# Patient Record
Sex: Female | Born: 1935 | Race: White | Hispanic: No | Marital: Single
Health system: Southern US, Community
[De-identification: ages and names within clinical notes are randomized; demographics above are authoritative.]

---

## 2008-07-27 ENCOUNTER — Emergency Department: Payer: Self-pay | Admitting: Emergency Medicine

## 2008-08-08 ENCOUNTER — Ambulatory Visit: Payer: Self-pay | Admitting: Family Medicine

## 2008-08-09 ENCOUNTER — Ambulatory Visit: Payer: Self-pay | Admitting: Family Medicine

## 2013-01-01 ENCOUNTER — Emergency Department: Payer: Self-pay | Admitting: Emergency Medicine

## 2013-01-01 LAB — URINALYSIS, COMPLETE
Bacteria: NONE SEEN
Bilirubin,UR: NEGATIVE
Glucose,UR: NEGATIVE mg/dL (ref 0–75)
Ketone: NEGATIVE
Nitrite: NEGATIVE
Ph: 6 (ref 4.5–8.0)
RBC,UR: 59 /HPF (ref 0–5)
Specific Gravity: 1.019 (ref 1.003–1.030)

## 2013-01-03 LAB — URINE CULTURE

## 2013-01-11 LAB — CBC
HCT: 34.9 % — ABNORMAL LOW (ref 35.0–47.0)
HGB: 11.8 g/dL — ABNORMAL LOW (ref 12.0–16.0)
MCHC: 33.9 g/dL (ref 32.0–36.0)
Platelet: 212 10*3/uL (ref 150–440)
RBC: 3.91 10*6/uL (ref 3.80–5.20)

## 2013-01-12 ENCOUNTER — Inpatient Hospital Stay: Payer: Self-pay | Admitting: Internal Medicine

## 2013-01-12 LAB — BASIC METABOLIC PANEL
Anion Gap: 11 (ref 7–16)
Anion Gap: 12 (ref 7–16)
Calcium, Total: 8.7 mg/dL (ref 8.5–10.1)
Chloride: 107 mmol/L (ref 98–107)
Chloride: 112 mmol/L — ABNORMAL HIGH (ref 98–107)
Co2: 17 mmol/L — ABNORMAL LOW (ref 21–32)
Creatinine: 10.17 mg/dL — ABNORMAL HIGH (ref 0.60–1.30)
Creatinine: 11.04 mg/dL — ABNORMAL HIGH (ref 0.60–1.30)
Creatinine: 11.77 mg/dL — ABNORMAL HIGH (ref 0.60–1.30)
EGFR (African American): 3 — ABNORMAL LOW
EGFR (African American): 3 — ABNORMAL LOW
EGFR (Non-African Amer.): 2 — ABNORMAL LOW
EGFR (Non-African Amer.): 3 — ABNORMAL LOW
EGFR (Non-African Amer.): 3 — ABNORMAL LOW
Glucose: 87 mg/dL (ref 65–99)
Osmolality: 319 (ref 275–301)
Osmolality: 322 (ref 275–301)
Osmolality: 323 (ref 275–301)
Potassium: 3.9 mmol/L (ref 3.5–5.1)
Sodium: 143 mmol/L (ref 136–145)
Sodium: 141 mmol/L (ref 136–145)

## 2013-01-12 LAB — CK: CK, Total: 35 U/L (ref 21–215)

## 2013-01-12 LAB — URINALYSIS, COMPLETE
Bacteria: NONE SEEN
Glucose,UR: NEGATIVE mg/dL (ref 0–75)
Leukocyte Esterase: NEGATIVE
Nitrite: NEGATIVE
Ph: 6 (ref 4.5–8.0)
Protein: NEGATIVE
RBC,UR: 13 /HPF (ref 0–5)
Squamous Epithelial: 2
WBC UR: 4 /HPF (ref 0–5)

## 2013-01-12 LAB — HEPATIC FUNCTION PANEL A (ARMC)
Albumin: 2.7 g/dL — ABNORMAL LOW (ref 3.4–5.0)
Alkaline Phosphatase: 50 U/L
Bilirubin, Direct: 0.2 mg/dL (ref 0.00–0.20)
Bilirubin,Total: 0.4 mg/dL (ref 0.2–1.0)
Total Protein: 5.6 g/dL — ABNORMAL LOW (ref 6.4–8.2)

## 2013-01-12 LAB — TSH: Thyroid Stimulating Horm: 3.53 u[IU]/mL

## 2013-01-12 LAB — HEMOGLOBIN: HGB: 10.9 g/dL — ABNORMAL LOW (ref 12.0–16.0)

## 2013-01-12 LAB — PHOSPHORUS: Phosphorus: 5.7 mg/dL — ABNORMAL HIGH (ref 2.5–4.9)

## 2013-01-13 LAB — BASIC METABOLIC PANEL
Calcium, Total: 8 mg/dL — ABNORMAL LOW (ref 8.5–10.1)
Chloride: 107 mmol/L (ref 98–107)
Co2: 23 mmol/L (ref 21–32)
EGFR (African American): 5 — ABNORMAL LOW
EGFR (Non-African Amer.): 4 — ABNORMAL LOW
Glucose: 90 mg/dL (ref 65–99)
Osmolality: 300 (ref 275–301)
Potassium: 3.8 mmol/L (ref 3.5–5.1)
Sodium: 140 mmol/L (ref 136–145)

## 2013-01-13 LAB — CBC WITH DIFFERENTIAL/PLATELET
Basophil #: 0 10*3/uL (ref 0.0–0.1)
Basophil %: 0.5 %
Eosinophil #: 0.1 10*3/uL (ref 0.0–0.7)
HCT: 28.3 % — ABNORMAL LOW (ref 35.0–47.0)
HGB: 9.8 g/dL — ABNORMAL LOW (ref 12.0–16.0)
MCHC: 34.5 g/dL (ref 32.0–36.0)
MCV: 88 fL (ref 80–100)
Monocyte #: 1.2 x10 3/mm — ABNORMAL HIGH (ref 0.2–0.9)
Neutrophil %: 75.5 %
Platelet: 159 10*3/uL (ref 150–440)
RBC: 3.2 10*6/uL — ABNORMAL LOW (ref 3.80–5.20)
RDW: 13.4 % (ref 11.5–14.5)
WBC: 9 10*3/uL (ref 3.6–11.0)

## 2013-01-14 LAB — URINE CULTURE

## 2013-01-14 LAB — RENAL FUNCTION PANEL
Albumin: 2.5 g/dL — ABNORMAL LOW (ref 3.4–5.0)
Anion Gap: 7 (ref 7–16)
BUN: 33 mg/dL — ABNORMAL HIGH (ref 7–18)
Calcium, Total: 8.3 mg/dL — ABNORMAL LOW (ref 8.5–10.1)
Chloride: 105 mmol/L (ref 98–107)
Creatinine: 4.67 mg/dL — ABNORMAL HIGH (ref 0.60–1.30)
EGFR (African American): 8 — ABNORMAL LOW
EGFR (Non-African Amer.): 7 — ABNORMAL LOW
Sodium: 140 mmol/L (ref 136–145)

## 2013-01-14 LAB — HEMOGLOBIN: HGB: 10.3 g/dL — ABNORMAL LOW (ref 12.0–16.0)

## 2013-01-15 LAB — BASIC METABOLIC PANEL
Calcium, Total: 8.1 mg/dL — ABNORMAL LOW (ref 8.5–10.1)
Co2: 29 mmol/L (ref 21–32)
EGFR (African American): 14 — ABNORMAL LOW
EGFR (Non-African Amer.): 12 — ABNORMAL LOW
Glucose: 97 mg/dL (ref 65–99)
Osmolality: 274 (ref 275–301)
Potassium: 3.2 mmol/L — ABNORMAL LOW (ref 3.5–5.1)

## 2013-01-15 LAB — PHOSPHORUS: Phosphorus: 3.2 mg/dL (ref 2.5–4.9)

## 2013-01-17 ENCOUNTER — Ambulatory Visit: Payer: Self-pay | Admitting: Internal Medicine

## 2013-01-17 LAB — BASIC METABOLIC PANEL
BUN: 16 mg/dL (ref 7–18)
Calcium, Total: 7.8 mg/dL — ABNORMAL LOW (ref 8.5–10.1)
Co2: 26 mmol/L (ref 21–32)
Creatinine: 3.14 mg/dL — ABNORMAL HIGH (ref 0.60–1.30)
Glucose: 82 mg/dL (ref 65–99)
Sodium: 142 mmol/L (ref 136–145)

## 2013-01-18 LAB — MAGNESIUM: Magnesium: 1.5 mg/dL — ABNORMAL LOW

## 2013-01-18 LAB — BASIC METABOLIC PANEL
BUN: 13 mg/dL (ref 7–18)
EGFR (Non-African Amer.): 12 — ABNORMAL LOW
Glucose: 91 mg/dL (ref 65–99)
Potassium: 4.2 mmol/L (ref 3.5–5.1)
Sodium: 141 mmol/L (ref 136–145)

## 2013-01-19 LAB — BASIC METABOLIC PANEL
Anion Gap: 12 (ref 7–16)
BUN: 14 mg/dL (ref 7–18)
Co2: 16 mmol/L — ABNORMAL LOW (ref 21–32)
EGFR (African American): 15 — ABNORMAL LOW
Osmolality: 287 (ref 275–301)
Potassium: 3.6 mmol/L (ref 3.5–5.1)

## 2013-01-19 LAB — CBC WITH DIFFERENTIAL/PLATELET
Basophil #: 0.1 10*3/uL (ref 0.0–0.1)
Basophil %: 0.9 %
Eosinophil #: 0.2 10*3/uL (ref 0.0–0.7)
Lymphocyte #: 0.8 10*3/uL — ABNORMAL LOW (ref 1.0–3.6)
MCH: 30.6 pg (ref 26.0–34.0)
MCHC: 33.5 g/dL (ref 32.0–36.0)
Monocyte #: 0.7 x10 3/mm (ref 0.2–0.9)
Neutrophil %: 78.3 %
RDW: 13.9 % (ref 11.5–14.5)

## 2013-02-17 ENCOUNTER — Ambulatory Visit: Payer: Self-pay | Admitting: Internal Medicine

## 2013-05-18 DEATH — deceased

## 2014-06-09 NOTE — Consult Note (Signed)
CHIEF COMPLAINT and HISTORY:  Subjective/Chief Complaint renal failure, poor appetite   History of Present Illness Patient is a 81 yo WF with one week of lethargy and poor appetite.  Found to be in renal failure with BUN >100 and Cr over 11.  She is a poor history.   We are asked to place HD catheter   PAST MEDICAL/SURGICAL HISTORY:  Past Medical History:   stroke:    Hypothyroidism:    diabetes:    hypertension:    pacemaker:   ALLERGIES:  Allergies:  No Known Allergies:   HOME MEDICATIONS:  Home Medications: Medication Instructions Status  atorvastatin 10 mg oral tablet 1 tab(s) orally once a day (at bedtime) Active  atenolol 25 mg oral tablet 1 tab(s) orally once a day Active  Aspirin Enteric Coated 81 mg oral delayed release tablet 1 tab(s) orally once a day Active  levothyroxine 50 mcg (0.05 mg) oral tablet 1 tab(s) orally once a day Active   Family and Social History:  Family History Non-Contributory   Social History negative tobacco, negative ETOH   Review of Systems:  ROS Pt not able to provide ROS   Physical Exam:  GEN thin, elderly WF in NAD   HEENT pink conjunctivae, very hard of hearing   NECK No masses  trachea midline   RESP normal resp effort  no use of accessory muscles   CARD regular rate  no JVD   VASCULAR ACCESS none   ABD denies tenderness  normal BS   GU foley catheter in place  clear yellow urine draining  no superpubic tenderness   LYMPH negative neck, negative axillae   EXTR negative cyanosis/clubbing, negative edema   SKIN normal to palpation, skin turgor good   NEURO cranial nerves intact, motor/sensory function intact   PSYCH alert, A+O to time, place, person   LABS:  Laboratory Results: Thyroid:    26-Nov-14 08:17, Thyroid Stimulating Hormone  Thyroid Stimulating Hormone 3.53  0.45-4.50  (International Unit)   -----------------------  Pregnant patients have   different reference   ranges for TSH:   - - - - -  - - - - -   Pregnant, first trimetser:   0.36 - 2.50 uIU/mL  Hepatic:    26-Nov-14 08:17, Hepatic Function Panel A  Bilirubin, Total 0.4  Bilirubin, Direct 0.2  Result(s) reported on 12 Jan 2013 at 08:54AM.  Alkaline Phosphatase 50  45-117  NOTE: New Reference Range  01/07/13  SGPT (ALT) 9  SGOT (AST) 10  Total Protein, Serum 5.6  Albumin, Serum 2.7  Routine Chem:    25-Nov-14 76:81, Basic Metabolic Panel (w/Total Calcium)  Glucose, Serum 122  BUN 130  Creatinine (comp) 11.77  Sodium, Serum 140  Potassium, Serum 4.2  Chloride, Serum 107  CO2, Serum 22  Calcium (Total), Serum 8.7  Anion Gap 11  Osmolality (calc) 323  eGFR (African American) 3  eGFR (Non-African American) 2  eGFR values <25m/min/1.73 m2 may be an indication of chronic  kidney disease (CKD).  Calculated eGFR is useful in patients with stable renal function.  The eGFR calculation will not be reliable in acutely ill patients  when serum creatinine is changing rapidly. It is not useful in   patients on dialysis. The eGFR calculation may not be applicable  to patients at the low and high extremes of body sizes, pregnant  women, and vegetarians.    26-Nov-14 015:72 Basic Metabolic Panel (w/Total Calcium)  Glucose, Serum 87  BUN 118  Creatinine (  comp) 11.04  Sodium, Serum 143  Potassium, Serum 4.4  Chloride, Serum 113  CO2, Serum 16  Calcium (Total), Serum 8.0  Anion Gap 14  Osmolality (calc) 322  eGFR (African American) 3  eGFR (Non-African American) 3  eGFR values <55m/min/1.73 m2 may be an indication of chronic  kidney disease (CKD).  Calculated eGFR is useful in patients with stable renal function.  The eGFR calculation will not be reliable in acutely ill patients  when serum creatinine is changing rapidly. It is not useful in   patients on dialysis. The eGFR calculation may not be applicable  to patients at the low and high extremes of body sizes, pregnant  women, and vegetarians.  Misc Urine  Chem:    26-Nov-14 23:37, Sodium, Urine Random  Sodium, Urine Random 46  Result(s) reported on 12 Jan 2013 at 09:25AM.  Cardiac:    26-Nov-14 08:17, CK, Total  CK, Total 35  Result(s) reported on 12 Jan 2013 at 09:48AM.  Routine UA:    25-Nov-14 23:37, Urinalysis  Color (UA) Yellow  Clarity (UA) Clear  Glucose (UA) Negative  Bilirubin (UA) Negative  Ketones (UA) Negative  Specific Gravity (UA) 1.011  Blood (UA) 2+  pH (UA) 6.0  Protein (UA) Negative  Nitrite (UA) Negative  Leukocyte Esterase (UA) Negative  Result(s) reported on 12 Jan 2013 at 12:05AM.  RBC (UA) 13 /HPF  WBC (UA) 4 /HPF  Bacteria (UA)   NONE SEEN  Epithelial Cells (UA) 2 /HPF  Result(s) reported on 12 Jan 2013 at 12:05AM.  Routine Hem:    25-Nov-14 23:32, Hemogram, Platelet Count  WBC (CBC) 10.2  RBC (CBC) 3.91  Hemoglobin (CBC) 11.8  Hematocrit (CBC) 34.9  Platelet Count (CBC) 212  Result(s) reported on 11 Jan 2013 at 11:56PM.  MCV 89  MCH 30.2  MCHC 33.9  RDW 13.9    26-Nov-14 08:17, Hemoglobin  Hemoglobin (CBC) 10.9  Result(s) reported on 12 Jan 2013 at 08:41AM.   RADIOLOGY:  Radiology Results: XRay:    15-Nov-14 07:49, Chest Portable Single View  Chest Portable Single View  REASON FOR EXAM:    ams  COMMENTS:       PROCEDURE: DXR - DXR PORTABLE CHEST SINGLE VIEW  - Jan 01 2013  7:49AM     CLINICAL DATA:  Shortness of breath    EXAM:  PORTABLE CHEST - 1 VIEW    COMPARISON:  07/27/2008    FINDINGS:  Mild cardiomegaly without edema or definite pneumonia. Pacer device  obscures the left lower lobe. No large effusion or pneumothorax.  Atherosclerosis of the aorta and subclavian vasculature. No new  collapse or consolidation. Overall stable exam.     IMPRESSION:  Stable cardiomegaly without CHF or pneumonia.    Aortic atherosclerosis      Electronically Signed    By: TDaryll BrodM.D.    On: 01/01/2013 07:52         Verified By: MEarl Gala M.D.,  [place]    26-Nov-14  09:35, [place]  [place]  REASON FOR EXAM:    ARF  COMMENTS:       PROCEDURE: [place] - [place]  - Jan 12 2013  9:35AM     CLINICAL DATA:  Acute renal failure    EXAM:  RENAL/URINARY TRACT ULTRASOUND COMPLETE    COMPARISON:  Unenhanced CT abdomen pelvis dated 08/09/2008    FINDINGS:  Right Kidney  Length: 9.2 cm. 9 x 10 x 10 mm  probable lower pole cyst, possibly  complicated by at least one internal septation, poorly evaluated. No  hydronephrosis.    Left Kidney    Length: 10.9 cm. 3.5 x 2.7 x 2.2 cm complex/ irregular lower pole  cyst, likely corresponding to a 2.8 x 2.0 cm suspected hemorrhagic  cyst on prior 2010 CT. 7 x 6 x 9 mm probable upper pole renal cyst.  No hydronephrosis.    Bladder    Not visualized with indwelling Foley catheter.   IMPRESSION:  No hydronephrosis.    3.5 cm complex/irregular left lower pole renal cyst, corresponding  to a suspected 2.8 cm hemorrhagic cyst on prior 2010 CT.    10 mm right lower pole renal cyst, possibly complicated by at least  one internal septation, likely benign.      Electronically Signed    By: Julian Hy M.D.    On: 01/12/2013 10:10       Verified By: Julian Hy, M.D.,  LabUnknown:    15-Nov-14 07:49, Chest Portable Single View  PACS Image    [id number] 09:35, US Kidney Bilateral  PACS Image   ASSESSMENT AND PLAN:  Assessment/Admission Diagnosis renal failure   Plan HD catheter placed at bedside without difficulty   level 3   Electronic Signatures: Algernon Huxley (MD)  (Signed [id number] 16:28)  Authored: Chief Complaint and History, PAST MEDICAL/SURGICAL HISTORY, ALLERGIES, HOME MEDICATIONS, Family and Social History, Review of Systems, Physical Exam, LABS, RADIOLOGY, Assessment and Plan   Last Updated: 26-Nov-14 16:28 by Algernon Huxley (MD)

## 2014-06-09 NOTE — Discharge Summary (Signed)
PATIENT NAME:  Carolyn Flores, Carolyn Flores MR#:  [id number] DATE OF BIRTH:  [birth year]  DATE OF ADMISSION:  01/12/2013 DATE OF DISCHARGE:  01/20/2013  ADMITTING DIAGNOSIS:  Acute renal failure.    DISCHARGE DIAGNOSES: 1.  Acute renal failure status post hemodialysis with catheter placement by Dr. Wyn Quaker and a few hemodialysis sessions. 2.  Altered mental status, likely metabolic encephalopathy. 3.  Suspected dementia.  4.  Hypertension.  5.  Hypothyroidism.  6.  Acidosis due to renal failure.  7.  Hypokalemia.  8.  Hypomagnesemia. 9.  Hypoxia, acute respiratory failure with fluid overload, likely congestive heart failure.  10. Diabetes mellitus.   OPERATIONS AND PROCEDURES: Right thigh non-tunneled hemodialysis catheter placement by Dr. Wyn Quaker on 01/12/2013.   DISCHARGE CONDITION: Poor.   DISCHARGE MEDICATIONS: The patient is to resume atorvastatin 10 mg p.o. at bedtime, levothyroxine 50 mcg p.o. daily, morphine 5 mg every 1 to 2 hours as needed, lorazepam  0.5 mg 3 times daily as needed. The patient was advised not to take any other medications unless recommended by a primary care physician or other medical doctor.   HOME OXYGEN: With 2 liters of oxygen through nasal cannula.   DIET: Two-gram salt, low-fat, low-cholesterol, carbohydrate-controlled diet, mechanical soft.     FOLLOWUP APPOINTMENT: With Dr. Dayna Barker in 2 days after discharge.   The patient is being discharged to home with hospice.   CONSULTANTS: Dr. Thedore Mins, Dr. Harvie Junior, Dr. Mady Haagensen, Dr. Wyn Quaker.  RADIOLOGIC STUDIES:  Chest, portable single view, 01/16/2013, showed COPD and probable CHF.   The patient is a 81 year old Caucasian female with past medical history significant for history of hypothyroidism, hypertension, as well as diabetes mellitus, history of stroke, who presented to the hospital with altered mental status, as well as abnormal labs. Please refer to Dr. Clarita Leber  admission note on 01/12/2013. Apparently, the patient  was seen by her primary care physician. Her labs were checked, and her labs were markedly abnormal. She was sent to the Emergency Room where her labs were checked and BUN was found to be 130 and creatinine was 11.7. Urine was unremarkable. The patient's physical exam was unremarkable. EKG was not done. Otherwise, lab studies revealed a glucose of 122. Liver enzymes were remarkable for albumin level of 2.7. CK total was 35. Thyroid stimulating hormone was 3.53. White blood cell count was normal at 10.2. Hemoglobin was 11.8 and platelet count was 212. Urine sodium in urine random was 46. Urine culture showed no growth. The patient was admitted to the hospital for further evaluation. She was started on IV fluids. Consultation with nephrologist was obtained, as well as vascular surgeon. The vascular surgeon saw the patient in consultation. Discussion was held about possibility about hemodialysis. The patient's family decided that they would like to try at a few hemodialysis sessions, so Dr. Festus Barren placed a temporary non-tunneled  dialysis catheter in the right thigh. Post procedure, the patient was taken to hemodialysis, and she underwent a few hemodialysis sessions.  With hemodialysis, the patient's creatinine improved and urine output somewhat increased. The patient was noted to be somewhat hypotensive post hemodialysis and was initiated on IV fluids. She was continued on IV fluids until she was fluid overloaded, developed shortness of breath and her chest x-ray revealed congestive heart failure. At that point, she was given Lasix and no more IV fluids. The patient continues to make urine and makes approximately 325 to 450 a shift in 8 hours. The patient's oral intake, however, is very poor.  Palliative care to discuss this with family, and family decided that hospice care would be appropriate at home. The patient is being discharged home with hospice on 01/20/2013. As the patient was somewhat short of breath, she  required some oxygen supplementation; however, with diuresis, the patient's oxygenation has improved. Since the patient is at risk of fluid overload and retention, we are to continue her on oxygen therapy. In regards to diabetes mellitus, as her p.o. intake was unsatisfactory, the patient, at this point, is to continue any diet she wants to. For altered mental status thought to be metabolic encephalopathy and likely related to renal failure. With hemodialysis, as well as improvement of her kidney function, the patient's mental status also improved; however, the patient still remained somewhat withdrawn and would not communicate with physician; however, according to the patient's family, she was communicating with the family quite well. I suspect some dementia. In regards to hypothyroidism, the patient's TSH was normal. The patient's Synthroid was resumed.  The patient was noted to be acidotic initially on arrival to the hospital. Acidosis resolved with hemodialysis. It was felt to be due to acute renal failure. The patient also had multiple metabolic derangements with potassium, as well as magnesium abnormalities, which were supplemented as needed. The patient is being discharged in stable condition with the above-mentioned medications and followup. She is not to use any blood pressure medications in order to keep blood pressure stable.   On the day of discharge, the patient's vital signs: Temperature is 98.2, however, ranging between 96.5 to 99.9. Pulse is ranging from 85 to 111. Respiration rate was 18 to 19. Blood pressure ranging from 99 to 135 systolic and 60s to 90s diastolic. O2 sats were 94% to 95% on  room air at rest.   TIME SPENT: 40 minutes.   ____________________________ Katharina Caperima Xander Jutras, MD rv:dmm D: 01/20/2013 18:45:00 ET T: 01/20/2013 21:49:58 ET JOB#: [id number]  cc: Katina DungBarbara D. Dayna BarkerAldridge, MD Katharina Caperima Anjani Feuerborn, MD, <Dictator> Alabama Doig MD ELECTRONICALLY SIGNED 01/26/2013 15:35

## 2014-06-09 NOTE — Op Note (Signed)
PATIENT NAME:  Carolyn Flores, Carolyn Flores MR#:  [id number] DATE OF BIRTH:  04/06/1913  DATE OF PROCEDURE:  01/12/2013  PREOPERATIVE DIAGNOSIS: Renal failure with uremia.   POSTOPERATIVE DIAGNOSIS: Renal failure with uremia.   PROCEDURES:   1.  Ultrasound guidance for vascular access, right femoral vein.  2.  Placement of right femoral Trialysis-type dialysis catheter, 30 cm in length.   SURGEON: Annice NeedyJason Flores. Dew, MD   ANESTHESIA: Local.   ESTIMATED BLOOD LOSS: Minimal.   INDICATION FOR PROCEDURE: A 81 year old white female who is admitted with renal failure and a BUN of over 100. We were asked to place a dialysis catheter for immediate dialysis access.   DESCRIPTION OF PROCEDURE: The patient was laid flat on her floor bed. Her right groin was sterilely prepped and draped and a sterile surgical field was created. The femoral vein was visualized on ultrasound and accessed under direct ultrasound guidance without difficulty with a Seldinger needle. A J-wire was placed. After skin nick and dilatation, the 30 cm long Trialysis-type dialysis catheter was placed over the wire and the wire was removed. All 3 lumens withdrew dark red nonpulsatile blood and flushed easily with sterile saline. It was secured at the skin at 29 cm with 3 nylon sutures.    ____________________________ Annice NeedyJason Flores. Dew, MD jsd:jcm D: 01/12/2013 16:30:21 ET T: 01/12/2013 19:48:34 ET JOB#: [id number]  cc: Annice NeedyJason Flores. Dew, MD, <Dictator> Annice NeedyJASON Flores DEW MD ELECTRONICALLY SIGNED 01/24/2013 9:24

## 2014-06-09 NOTE — H&P (Signed)
PATIENT NAME:  Carolyn Flores, Carolyn Flores MR#:  [id number] DATE OF BIRTH:  [birth year]  DATE OF ADMISSION:  01/12/2013  PRIMARY CARE PHYSICIAN: Dr. Leim FabryBarbara Flores.   REFERRING PHYSICIAN: Dr. Lucrezia EuropeAllison Flores.    CHIEF COMPLAINT: Altered mental status and abnormal labs.   HISTORY OF PRESENT ILLNESS: Ms. Carolyn Flores is a 81 year old female with history of hypertension, diabetes mellitus not on any medication, previous history of CVA. Has been confused for the last 10 days. Was noted to have decreased p.o. intake. Concerning this, the patient was brought to the Emergency Department and was found to have a urinary tract infection. The patient was discharged home with ciprofloxacin. The patient continued to have worsening of her weakness with decreased p.o. intake including solids and liquids. Family also noted that the patient has decreased urine output. Concerning this, the patient was taken to the primary care physician where on initial labs the patient was found to be in acute renal failure. Concerning this, the patient was sent to the Emergency Department. Workup in the Emergency Department, the patient was noted to have BUN of 130 and creatinine of 11.7. Urine is negative for urinary tract infection. Unable to obtain any history from the patient as the patient is very hard of hearing. The history is mainly obtained from [name] daughter who is [name] medical power of attorney. At baseline, the patient is able to walk with the help of the family. The patient is able to carry on a conversation if she can hear properly.   PAST MEDICAL HISTORY:  1. Hypertension.  2. Hyperlipidemia.  3. Diabetes mellitus, not on any medication.  4. Previous history of stroke.  5. Pacemaker and defibrillator placement.   ALLERGIES: No known drug allergies.   HOME MEDICATIONS: Ciprofloxacin 1 tablet every 12 hours.   SOCIAL HISTORY: No history of smoking, drinking alcohol or using illicit drugs. Married. Lives with her  2 sons. Has good family support.   FAMILY HISTORY: No obvious health problems run in the family. Has longevity in the family.   REVIEW OF SYSTEMS: Could not be obtained from the patient as the patient is confused as well as very hard of hearing.   PHYSICAL EXAMINATION:  GENERAL: This is a thin-built, frail-looking female, appropriate for stated age, lying down in the bed, lethargic.  VITAL SIGNS: Temperature 97.4, pulse 59, blood pressure 99/44, respiratory rate of 16, oxygen saturation is 92% on room air.  HEENT: Head normocephalic, atraumatic. There is no scleral icterus. Conjunctivae normal. Pupils equal and react to light. Mucous membranes dry. No pharyngeal erythema.  NECK: Supple. No lymphadenopathy. No JVD. No carotid bruit. No thyromegaly.  CHEST: Has no focal tenderness.  LUNGS: Bilaterally clear to auscultation.  HEART: S1, S2 regular. No murmurs are heard.  ABDOMEN: Bowel sounds present. Soft, nontender, nondistended. No hepatosplenomegaly.  EXTREMITIES: No pedal edema. Pulses 2+.  NEUROLOGIC: The patient is very hard of hearing. Difficult to know. [name] orientation seems to be confused; however, following simple commands. Could not examine the motor and sensory.  SKIN: No rash or lesions.  MUSCULOSKELETAL: Could not examine as the patient was not cooperative.   LABORATORIES: BMP: BUN 130, creatinine of 11.77, sodium 140. The rest of all of the values are within normal limits.   CBC: WBC of 10.2, hemoglobin 11.8, platelet count of 212. UA negative for nitrites and leukocyte esterase.   ASSESSMENT AND PLAN: Ms. Carolyn Flores is a 81 year old female who is brought to the Emergency Department for acute renal  failure.  1. Acute renal failure: This is a combination of prerenal as well as acute tubular necrosis secondary to hypertension. Continue with intravenous fluids. The patient is currently receiving 1 liter fluid bolus. Admit the patient to a monitored bed. Continue to monitor  strict ins and outs. Place a Foley catheter. Check urine sodium.  2. Diabetes mellitus: Currently not on any medications. Blood sugar are well within normal limits.  3. Hypertension: Not on any medications.  4. Debility: Will involve physical therapy once the patient is better oriented and improvement with the renal function.   Goals of care discussed with [name] family, and [name] family wants her to be DNR/DNI.   TIME SPENT: 50 minutes.   ____________________________ Carolyn Griffins, MD pv:gb D: 01/12/2013 01:21:54 ET T: 01/12/2013 02:28:42 ET JOB#: [id number]  cc: Carolyn Griffins, MD, <Dictator> Katina Dung. Dayna Barker, MD Clerance Lav Clanton Emanuelson MD ELECTRONICALLY SIGNED 01/16/2013 0:45

## 2014-06-10 NOTE — Consult Note (Signed)
PATIENT NAME:  Carolyn MerlesCOOPER, Alyn S MR#:  [id number] DATE OF BIRTH:  [birth year]  DATE OF CONSULTATION:  01/12/2013  REFERRING PHYSICIAN:  Katharina Caperima Vaickute, MD  CONSULTING PHYSICIAN:  Jeret Goyer Lizabeth LeydenN. Souleymane Saiki, MD  REASON FOR CONSULTATION: Severe acute renal failure.   HISTORY OF PRESENT ILLNESS: The patient is a 81 year old Caucasian female with past medical history of hypertension, hyperlipidemia, history of diabetes mellitus, history of CVA, history of pacemaker and defibrillator placement, who [facility] with altered mental status and decreased p.o. intake. The patient is unable to offer any history at this point in time. The patient's daughter is at bedside and offers most of the history.   It appears that the patient has had rather poor p.o. intake for at least the past 7 days according to her daughter. In addition, the patient has had altered mental status and her speech at times has been irregular. She is followed regularly as an outpatient by Dr. Leim FabryBarbara Aldridge. The patient was taken there for evaluation and was told that she had severe acute renal failure.   Upon presentation here, the patient's BUN was 130 with a creatinine of 11.7. Baseline BUN and creatinine are 18 and 1.28, respectively. After a short course of hydration, BUN was slightly down to 118 and creatinine was slightly down to 11.04. However, this may have been likely dilutional. Urine output was only 230 mL over the past 24 hours. We spoke with the patient's daughter and granddaughter in depth. They preferred a short trial of dialysis to see if the patient's condition would improve.   CODE STATUS: HOWEVER, THE PATIENT DOES HAVE DNR STATUS AT THIS TIME.   PAST MEDICAL HISTORY:  1.  Hypertension.  2.  Diabetes mellitus.  3.  Hyperlipidemia.  4.  Prior history of stroke.  5.  History of pacemaker placement.   ALLERGIES: No known drug allergies.   CURRENT INPATIENT MEDICATIONS:  Include:  1.   Normal saline 0.9 at 125 mL/hour.  2.  Sodium bicarbonate 150 mEq at 50 mL/hour. 3.  Heparin 5000 units subcu q.8 hours.  4.  Sliding scale insulin.  5.  Levothyroxine 0.05 mg p.o. q.6:00 a.m.  6.  Zofran 4 mg IV q.4 hours p.r.n.   SOCIAL HISTORY: The patient resides with her sons. Her daughter also lives nearby. No reported tobacco, alcohol, or illicit drug use.   FAMILY HISTORY: The patient's mother died at an old age. No reported medical issues, however.   REVIEW OF SYSTEMS: Unable to obtain from the patient as she has altered mental status at this time.   PHYSICAL EXAMINATION:  VITAL SIGNS: Temperature 97.3, pulse 79, respirations 16, blood pressure 100/55.  GENERAL: A lethargic but arousable female.  HEENT: Normocephalic, atraumatic. This patient did have spontaneous extraocular movements noted. Pupils equal, round, and reactive to light. No scleral icterus. Conjunctivae are pink. No epistaxis noted. Gross hearing was difficult to assess. Oral mucosae are dry.  NECK: Supple without JVD or lymphadenopathy.  LUNGS: Clear to auscultation bilaterally with normal effort.  HEART: S1, S2, regular rate and rhythm. No obvious pericardial friction rub.  ABDOMEN: Soft, nontender, nondistended. Bowel sounds positive. No rebound or guarding. No gross organomegaly appreciated.  EXTREMITIES: No clubbing, cyanosis, or edema.  NEUROLOGIC: Lethargic but is arousable. She will follow some simple commands if persistent.  SKIN: Warm and dry. Decreased skin turgor noted.  GENITOURINARY: No suprapubic tenderness noted at this time.  MUSCULOSKELETAL: No joint redness, swelling, or tenderness appreciated.  PSYCHIATRIC:  Lethargic but arousable. Does not appear to have any insight into her current illness.   LABORATORY, DIAGNOSTIC AND RADIOLOGICAL DATA: Urine sodium is 46.   A renal ultrasound shows no hydronephrosis. There is a 3.5 cm complex irregular left lower pole renal cyst which corresponded to a  suspected 2 cm hemorrhagic cyst on prior 2010 CT. There is also a 10 mm right lower pole renal cyst complicated by at least 1 internal septation.   BMP shows sodium 143, potassium 4.4, chloride 113, CO2 16, BUN 118, creatinine 11, glucose 87, total bilirubin 0.4, direct bilirubin 0.2, alkaline phosphatase 50, ALT 9, AST 10, total protein 5.6, albumin 2.7, hemoglobin 10.9. TSH 3.53, CK 35. Urinalysis shows 13 RBCs per high-power field, 4 WBCs per high-power field, negative for protein.   IMPRESSION: This is a 81 year old Caucasian female with past medical history of hypertension, hyperlipidemia, diabetes mellitus, prior history of cerebrovascular accident, history of pacemaker placement, who presented to [facility] with poor p.o. intake, altered mental status, and severe acute renal failure.   PROBLEM LIST:  1.  Severe acute renal failure with oliguria.  2.  Metabolic acidosis.  3.  Altered mental status.  4.  Complex left renal cyst.   PLAN: The patient presented with altered mental status, poor p.o. intake, and severe acute renal failure. Suspect that the most likely etiology of her underlying acute renal failure is acute tubular necrosis from prolonged dehydration and prerenal state. Apparently, despite her advanced age, the patient was doing relatively well at home prior to the current illness. She was able to ambulate with the assistance of her sons as well as with a walker. Her mentation prior to this current illness was fairly good according to her daughter.   However, the patient's family does realize that she has advanced age and quite severe medical issues at this time. We talked about different treatment options including a short trial of dialysis versus seeking hospice services. The patient's family prefers a short trial of hemodialysis to see if it improves her condition. A time-limited trial of renal placement therapy would certainly be reasonable.   I appreciate  temporary hemodialysis catheter placement by Dr. Wyn Quaker. We will proceed with hemodialysis this evening with the dialysis session lasting 1.5 hours. We will plan for a blood flow rate of 150, dialysis flow rate of 300, and no ultrafiltration. I will discontinue 0.9 normal saline infusion and continue the bicarbonate infusion. The patient could potentially develop volume overload which may cause significant issue; therefore, we will discontinue the normal saline infusion. We will reassess her mental status after starting dialysis. We will likely plan for  a dialysis session tomorrow as well.   I would like to thank Dr. Winona Legato for this kind referral. Further plan as per as the patient progresses.   ____________________________ Lennox Pippins, MD mnl:np D: 01/12/2013 17:21:05 ET T: 01/12/2013 17:43:48 ET JOB#: [id number]  cc: Lennox Pippins, MD, <Dictator> Ria Comment Karena Kinker MD ELECTRONICALLY SIGNED 02/24/2013 11:24

## 2015-05-14 IMAGING — CR DG CHEST 1V PORT
1 series · 1 of 1 positions shown · non-contrast
Comparison: 07/27/2008

CLINICAL DATA: Shortness of breath

EXAM:
PORTABLE CHEST - 1 VIEW

[ap]
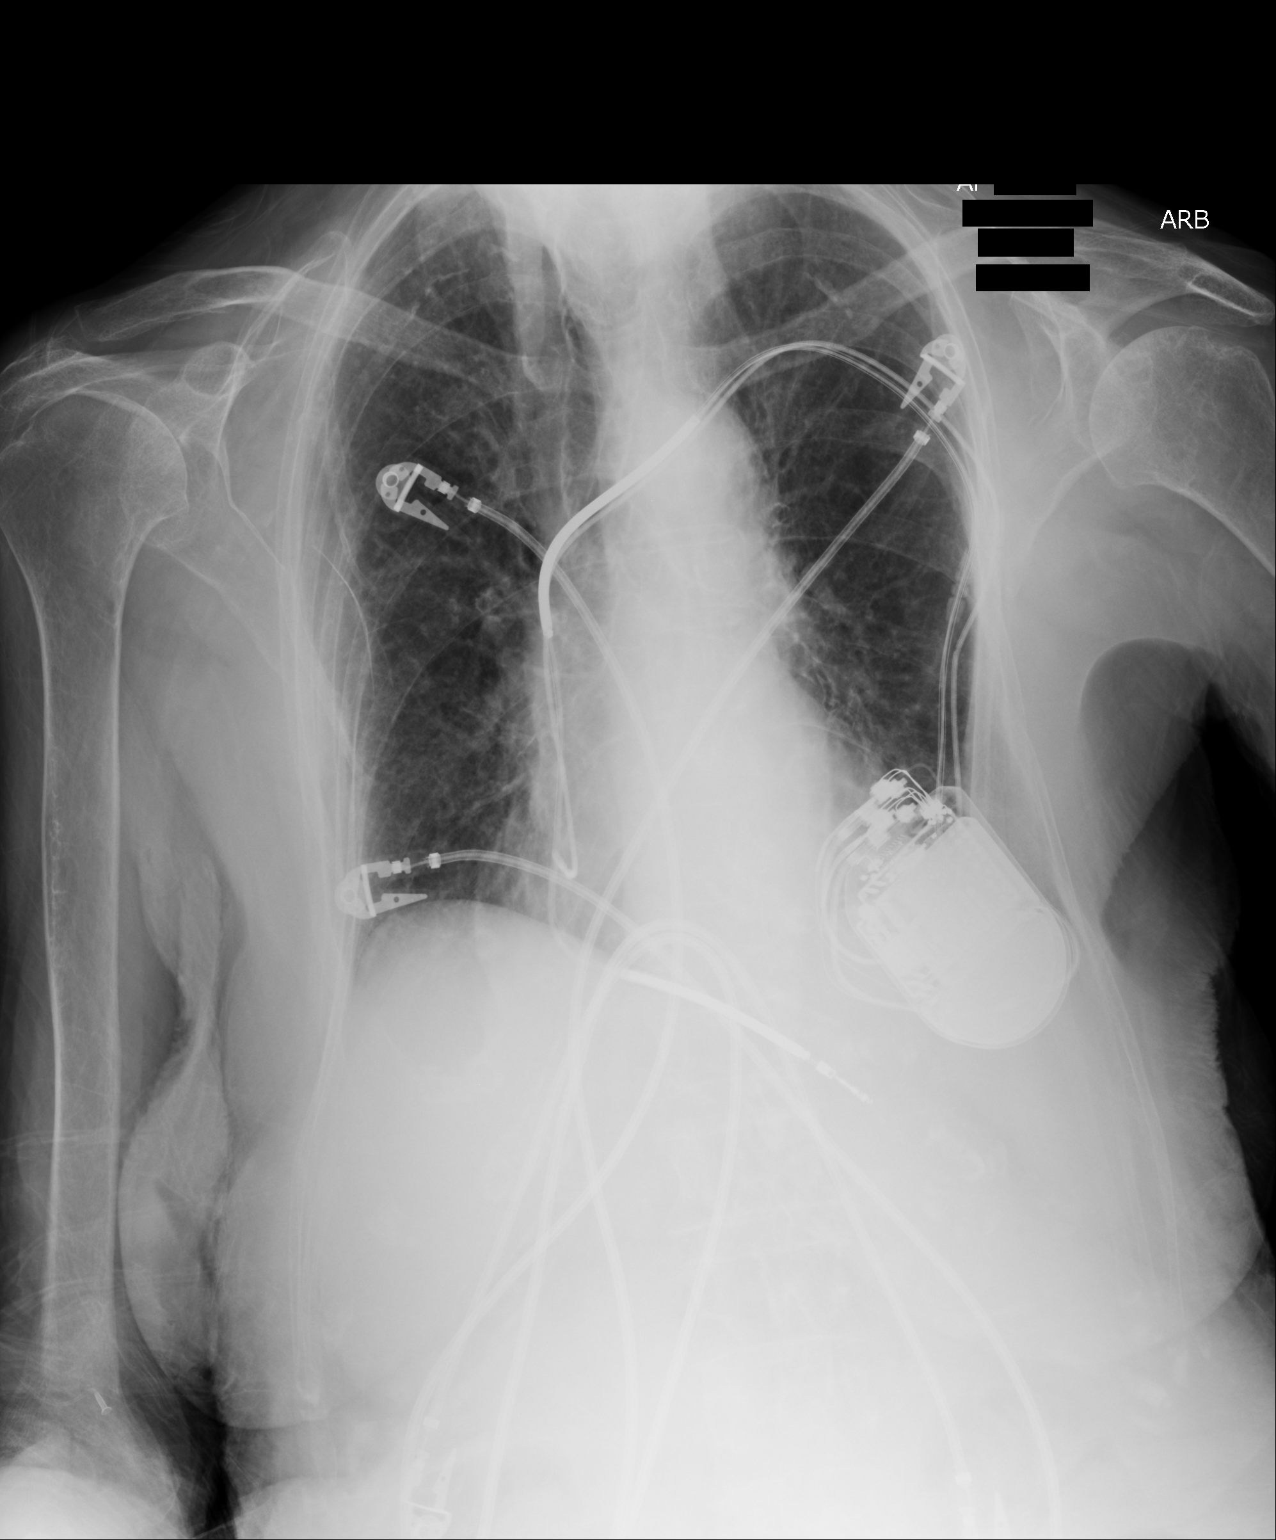

[1 of 1 positions shown; findings below may reference images not displayed]

FINDINGS: Mild cardiomegaly without edema or definite pneumonia. Pacer device
obscures the left lower lobe. No large effusion or pneumothorax.
Atherosclerosis of the aorta and subclavian vasculature. No new
collapse or consolidation. Overall stable exam.
IMPRESSION: Stable cardiomegaly without CHF or pneumonia.

Aortic atherosclerosis
